# Patient Record
Sex: Female | Born: 1975 | Race: White | Hispanic: No | Marital: Married
Health system: Southern US, Community
[De-identification: ages and names within clinical notes are randomized; demographics above are authoritative.]

## PROBLEM LIST (undated history)

## (undated) DIAGNOSIS — C73 Malignant neoplasm of thyroid gland: Secondary | ICD-10-CM

---

## 2004-10-01 ENCOUNTER — Other Ambulatory Visit: Admission: RE | Admit: 2004-10-01 | Discharge: 2004-10-01 | Payer: Self-pay | Admitting: Obstetrics and Gynecology

## 2005-03-27 ENCOUNTER — Ambulatory Visit (HOSPITAL_COMMUNITY): Admission: RE | Admit: 2005-03-27 | Discharge: 2005-03-27 | Payer: Self-pay | Admitting: Obstetrics and Gynecology

## 2006-03-11 ENCOUNTER — Inpatient Hospital Stay (HOSPITAL_COMMUNITY): Admission: AD | Admit: 2006-03-11 | Discharge: 2006-03-15 | Payer: Self-pay | Admitting: Obstetrics and Gynecology

## 2006-07-03 ENCOUNTER — Other Ambulatory Visit: Admission: RE | Admit: 2006-07-03 | Discharge: 2006-07-03 | Payer: Self-pay | Admitting: Obstetrics and Gynecology

## 2007-06-02 ENCOUNTER — Encounter: Admission: RE | Admit: 2007-06-02 | Discharge: 2007-06-02 | Payer: Self-pay | Admitting: Family Medicine

## 2007-06-15 ENCOUNTER — Encounter: Admission: RE | Admit: 2007-06-15 | Discharge: 2007-06-15 | Payer: Self-pay | Admitting: Family Medicine

## 2007-06-15 ENCOUNTER — Encounter (INDEPENDENT_AMBULATORY_CARE_PROVIDER_SITE_OTHER): Payer: Self-pay | Admitting: Interventional Radiology

## 2007-06-15 ENCOUNTER — Other Ambulatory Visit: Admission: RE | Admit: 2007-06-15 | Discharge: 2007-06-15 | Payer: Self-pay | Admitting: Interventional Radiology

## 2007-06-28 ENCOUNTER — Inpatient Hospital Stay (HOSPITAL_COMMUNITY): Admission: RE | Admit: 2007-06-28 | Discharge: 2007-07-01 | Payer: Self-pay | Admitting: Obstetrics and Gynecology

## 2007-07-03 ENCOUNTER — Inpatient Hospital Stay (HOSPITAL_COMMUNITY): Admission: AD | Admit: 2007-07-03 | Discharge: 2007-07-03 | Payer: Self-pay | Admitting: Obstetrics and Gynecology

## 2007-07-05 ENCOUNTER — Ambulatory Visit (HOSPITAL_COMMUNITY): Admission: RE | Admit: 2007-07-05 | Discharge: 2007-07-06 | Payer: Self-pay | Admitting: Surgery

## 2007-07-05 ENCOUNTER — Encounter (INDEPENDENT_AMBULATORY_CARE_PROVIDER_SITE_OTHER): Payer: Self-pay | Admitting: Surgery

## 2007-08-03 ENCOUNTER — Encounter: Admission: RE | Admit: 2007-08-03 | Discharge: 2007-08-03 | Payer: Self-pay | Admitting: Endocrinology

## 2007-08-10 ENCOUNTER — Encounter: Admission: RE | Admit: 2007-08-10 | Discharge: 2007-08-10 | Payer: Self-pay | Admitting: Endocrinology

## 2007-08-17 ENCOUNTER — Encounter: Admission: RE | Admit: 2007-08-17 | Discharge: 2007-08-17 | Payer: Self-pay | Admitting: Endocrinology

## 2007-11-09 ENCOUNTER — Other Ambulatory Visit: Admission: RE | Admit: 2007-11-09 | Discharge: 2007-11-09 | Payer: Self-pay | Admitting: Obstetrics and Gynecology

## 2008-08-14 ENCOUNTER — Encounter (HOSPITAL_COMMUNITY): Admission: RE | Admit: 2008-08-14 | Discharge: 2008-10-17 | Payer: Self-pay | Admitting: Endocrinology

## 2008-08-16 ENCOUNTER — Ambulatory Visit (HOSPITAL_COMMUNITY): Admission: RE | Admit: 2008-08-16 | Discharge: 2008-08-16 | Payer: Self-pay | Admitting: Endocrinology

## 2008-08-16 IMAGING — CR DG CHEST 2V
2 series · 2 of 2 positions shown · non-contrast
Comparison: none

CLINICAL DATA: Papillary thyroid carcinoma.  Pre op respiratory exam.  
 CHEST ? 2 VIEW:

[w chest pa]
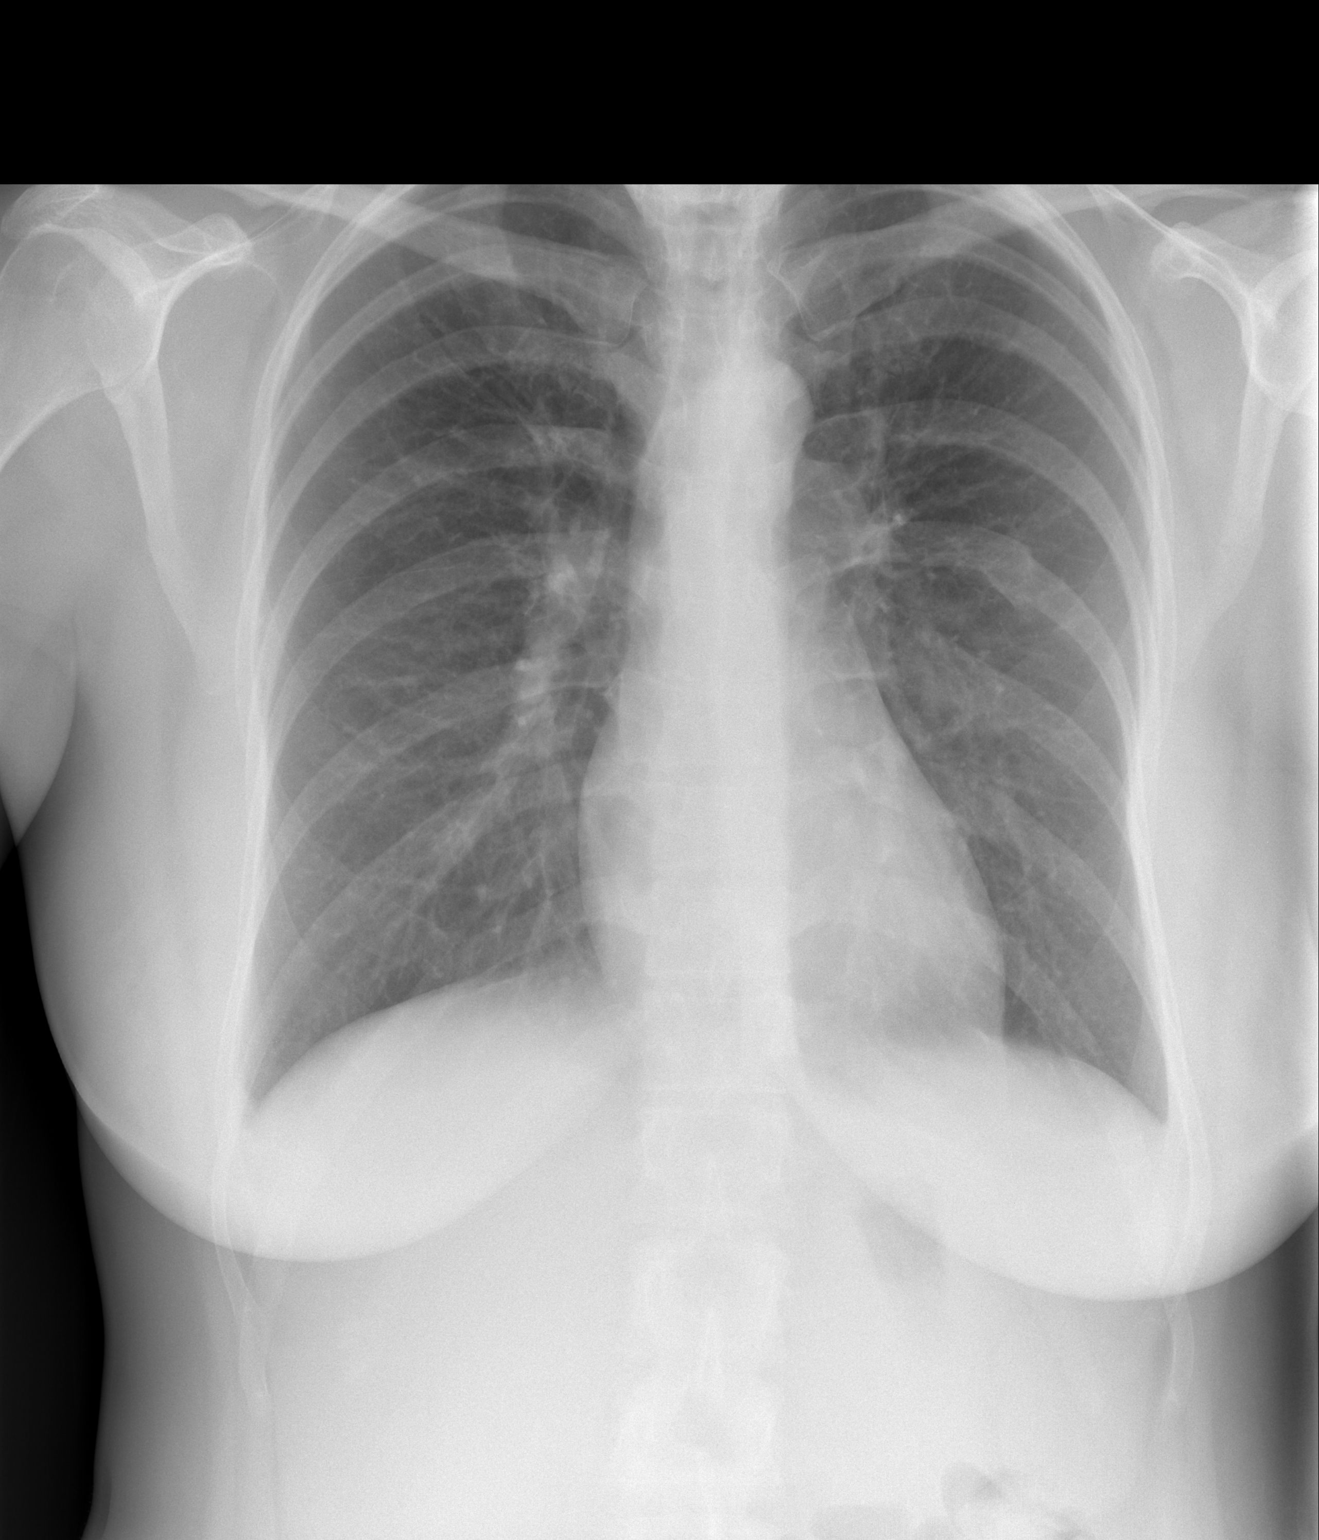

[w chest lat]
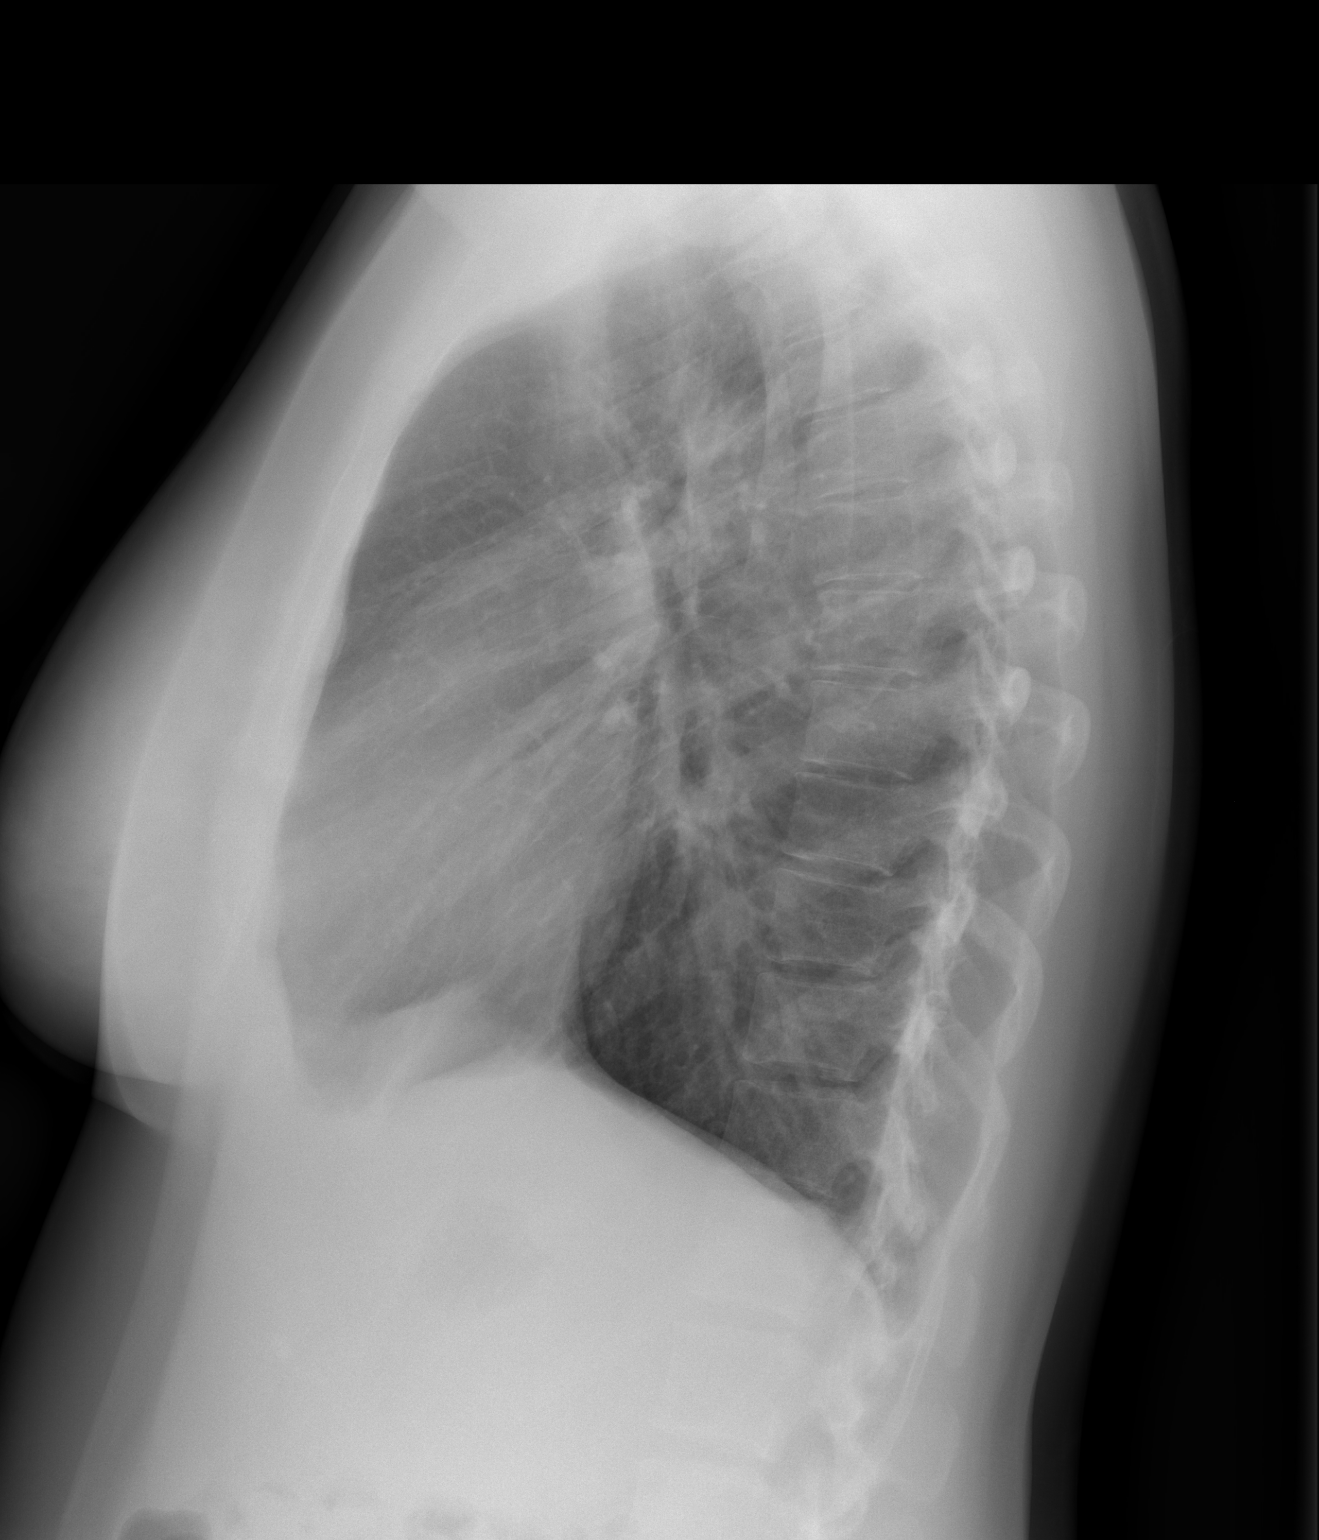

[2 of 2 positions shown; findings below may reference images not displayed]

FINDINGS: Heart size and mediastinal contours are normal.  Both lungs are clear.  There is no evidence of pleural effusion.  No mass or adenopathy identified.  Old left posterior seventh rib fracture is noted.
IMPRESSION: No active cardiopulmonary disease.

## 2008-10-18 ENCOUNTER — Encounter (HOSPITAL_COMMUNITY): Admission: RE | Admit: 2008-10-18 | Discharge: 2009-01-16 | Payer: Self-pay | Admitting: Endocrinology

## 2008-10-26 ENCOUNTER — Encounter (HOSPITAL_COMMUNITY): Admission: RE | Admit: 2008-10-26 | Discharge: 2009-01-24 | Payer: Self-pay | Admitting: Endocrinology

## 2008-11-10 ENCOUNTER — Other Ambulatory Visit: Admission: RE | Admit: 2008-11-10 | Discharge: 2008-11-10 | Payer: Self-pay | Admitting: Obstetrics and Gynecology

## 2009-08-13 ENCOUNTER — Encounter (HOSPITAL_COMMUNITY): Admission: RE | Admit: 2009-08-13 | Discharge: 2009-10-17 | Payer: Self-pay | Admitting: Endocrinology

## 2009-11-15 ENCOUNTER — Other Ambulatory Visit: Admission: RE | Admit: 2009-11-15 | Discharge: 2009-11-15 | Payer: Self-pay | Admitting: Obstetrics and Gynecology

## 2010-10-28 ENCOUNTER — Encounter (HOSPITAL_COMMUNITY)
Admission: RE | Admit: 2010-10-28 | Discharge: 2010-11-19 | Payer: Self-pay | Source: Home / Self Care | Attending: Endocrinology | Admitting: Endocrinology

## 2010-11-04 LAB — HCG, SERUM, QUALITATIVE

## 2010-11-10 ENCOUNTER — Encounter: Payer: Self-pay | Admitting: Family Medicine

## 2010-11-10 ENCOUNTER — Encounter: Payer: Self-pay | Admitting: Endocrinology

## 2010-11-28 ENCOUNTER — Other Ambulatory Visit (HOSPITAL_COMMUNITY)
Admission: RE | Admit: 2010-11-28 | Discharge: 2010-11-28 | Disposition: A | Discharge status: Home / Self Care | Payer: BC Managed Care – PPO | Source: Ambulatory Visit | Attending: Obstetrics and Gynecology | Admitting: Obstetrics and Gynecology

## 2010-11-28 ENCOUNTER — Other Ambulatory Visit: Payer: Self-pay | Admitting: Obstetrics and Gynecology

## 2010-11-28 DIAGNOSIS — Z1231 Encounter for screening mammogram for malignant neoplasm of breast: Secondary | ICD-10-CM

## 2010-11-28 DIAGNOSIS — Z01419 Encounter for gynecological examination (general) (routine) without abnormal findings: Secondary | ICD-10-CM | POA: Insufficient documentation

## 2010-12-16 ENCOUNTER — Ambulatory Visit
Admission: RE | Admit: 2010-12-16 | Discharge: 2010-12-16 | Disposition: A | Discharge status: Home / Self Care | Payer: BC Managed Care – PPO | Source: Ambulatory Visit | Attending: Obstetrics and Gynecology | Admitting: Obstetrics and Gynecology

## 2010-12-16 DIAGNOSIS — Z1231 Encounter for screening mammogram for malignant neoplasm of breast: Secondary | ICD-10-CM

## 2011-01-23 LAB — HCG, SERUM, QUALITATIVE: Preg, Serum: NEGATIVE

## 2011-02-03 LAB — HCG, SERUM, QUALITATIVE: Preg, Serum: NEGATIVE

## 2011-03-04 NOTE — Op Note (Signed)
Angela Burke, Angela Burke               ACCOUNT NO.:  [id number]   MEDICAL RECORD NO.:  [id number]          PATIENT TYPE:  INP   LOCATION:  9126                          [facility]ACILITY:  [facility]   PHYSICIAN:  Charles A. Delcambre, MDDATE OF BIRTH:  26-Sep-1976   DATE OF PROCEDURE:  06/28/2007  DATE OF DISCHARGE:                               OPERATIVE REPORT   PREOPERATIVE DIAGNOSES:  1. Intrauterine pregnancy at 37 weeks 3 days.  2. Previous cesarean section.  3. Newly-diagnosed thyroid cancer.   POSTOPERATIVE DIAGNOSES:  1. Intrauterine pregnancy at 37 weeks 3 days.  2. Previous cesarean section.  3. Newly-diagnosed thyroid cancer.   PROCEDURE:  Repeat low transverse cesarean section.   SURGEON:  Charles A. Sydnee Cabal, MD   ASSISTANT:  Gerald Leitz, MD   COMPLICATIONS:  Dense adhesions of the uterus to the anterior abdominal  wall.   OPERATIVE FINDINGS:  Vigorous female, Apgars 8 and 9, 9 pounds 3 ounces.  Placenta to L&D.   ESTIMATED BLOOD LOSS:  600 mL.   Nuchal cord X1 reduced for delivery with vacuum extraction.  Instrument,  sponge and needle count correct x2.   DESCRIPTION OF PROCEDURE:  The patient was taken to the operating room  and placed in supine position after spinal was injected without  difficulty.  She was then sterilely prepped and draped.  A repeat  incision through the old scar was made and taken down to fascia.  Fascia  was incised with a knife and Mayo scissors.  Rectus muscles were sharply  dissected in the midline.  Peritoneum was entered with hemostats and  Metzenbaum scissors without damage to bowel, bladder or vascular  structures.  Adhesions were isolated, crossclamped, cut and tied with 0  free ties.  Hemostasis was good.  This allowed mobilization of the  uterus adequately.  The lower uterine segment bladder flap was developed  with Metzenbaum scissors to incise the uterovesical peritoneum and some  sharp dissection was used to take the bladder down further.   The bladder  blade was replaced.  A lower uterine segment transverse incision was  made to amniotomy.  Vertical traction was applied.  A hand was inserted.  Head remained high.  A Mityvac bell-shaped vacuum was placed on the  occiput and the infant was delivered with fundal pressure by the  operator's assistance with the assistance of the vacuum extractor  without difficulty.  A nuchal cord was reduced.  Infant was further  delivered without difficulty.  Cord was clamped.  Baby was shown to the  parents and taken to the neonatologist who was in attendance.  Placenta  was manually extracted.  Uterus was closed then internally with #1  chromic running locking first layer, #1 chromic running imbricating  second layer.  There were several areas where the adhesions had been  taken down as well as left end of the incision on the uterus where there  was some oozing.  Vicryl 2-0 with an SH was used for figure-of-eight  sutures to these areas with good hemostasis resulting.  Irrigation was  carried out and hemostasis was excellent to  the bladder flap as well as  the uterine incision site as well as the adhesion sites.  The bladder  blade was then removed.  Subfascial hemostasis was good along the rectus  muscles subpubically.  Fascia was then closed with #1 Vicryl running  nonlocking suture.  Subcutaneous hemostasis was excellent after  irrigation.  Minor electrocautery was used and sterile skin clips were  used close the skin.  A sterile dressing was applied.  The patient  tolerated her procedure well.  She had received 2 g of Ancef after the  cord was clamped and as she is going to have thyroid surgery next  Monday, I would like to continue Ancef 2 g q.6h. for 24 hours.  All  questions were answered.  She was taken to the recovery room with  physician in attendance.      Charles A. Sydnee Cabal, MD  Electronically Signed     CAD/MEDQ  D:  06/28/2007  T:  06/29/2007  Job:  [id number]

## 2011-03-04 NOTE — H&P (Signed)
NAMEDALANA, Angela Burke               ACCOUNT NO.:  [id number]   MEDICAL RECORD NO.:  [id number]          PATIENT TYPE:  INP   LOCATION:  NA                            [facility]   PHYSICIAN:  Charles A. Delcambre, MDDATE OF BIRTH:  [birth year]   DATE OF ADMISSION:  DATE OF DISCHARGE:                              HISTORY & PHYSICAL   ADMISSION HISTORY AND PHYSICAL:  This patient will be admitted to  undergo repeat cesarean section at 37 weeks 3 days secondary to an  enlarging thyroid that has been diagnosed with thyroid papillary  carcinoma.  She is to have surgery with Dr. Ricky Stabs 10 days post  cesarean section and about 4-5 weeks later undergo the radiation  ablation with Dr. Deboraha Sprang at [facility].  She is a 35 year old gravida  2, para 1-0-0-1 with only complication of pregnancy being asthma where  she used albuterol, Allegra and Advair p.r.n., not often.   PAST MEDICAL HISTORY:  Asthma.   PAST SURGICAL HISTORY:  Cesarean section, primary low transverse, for  arrest of dilation at 8 cm CPD.   MEDICATIONS:  Albuterol, Allegra, Advair p.r.n., prenatal vitamins  daily.   TESTING PENDING:  Group B strep done on September 3.   ALLERGIES:  No known drug allergies.   SOCIAL HISTORY:  Married, in obvious relationship with her husband.  No  history of STDs.  No tobacco, ethanol or drugs.   FAMILY HISTORY:  Family history of suicide in an uncle.  Mother with  lupus.  Paternal grandmother with lung cancer.  Chronic hypertension in  father.  Heart disease in maternal grandfather and paternal grandfather.  Thyroid dysfunction in her mother and sisters.  Diabetes in maternal  grandmother and maternal grandfather.   REVIEW OF SYSTEMS:  Denies fever or chills.  She does have pressure on  her neck and when she swallows from a rapidly enlarging thyroid.  No  chest pain, shortness of breath, wheezing, contractions, rupture of  membranes or bleeding, diarrhea or constipation.   PHYSICAL  EXAMINATION:  GENERAL:  Alert and oriented x3.  No distress  except for bothersome thyroid as noted above which biopsy has returned  papillary carcinoma and biopsy done June 15, 2007.  LABORATORY:  A positive.  Rh negative.  VDRL nonreactive.  Rubella  immune.  Hepatitis B surface antigen negative.  HIV nonreactive.  She  declined first trimester screen and AFP.  One-hour Glucola 126.  Hemoglobin at 28 weeks of 12.3.  Group B strep done June 23, 2007,  and pending.  HEENT:  Right thyroid is grossly enlarged.  LUNGS:  Clear.  HEART:  Regular rate and rhythm with a 2/6 systolic ejection murmur.  ABDOMEN:  Gravid.  Fundal height 41.  Ultrasound was done August 13  showing 58% 15-cm AFI, estimated fetal weight 90%, 5 pounds 14 ounces at  that time.  Fetal heart rate 150s.  EXTREMITIES:  Minimal edema bilaterally.  CERVIX:  Not checked.   ASSESSMENT:  Intrauterine pregnancy at 37 weeks 3 days upon admission.  To undergo repeat low transverse cesarean section as recommended by Dr.  Jerkins, and I would concur, with newly diagnosed and rapidly enlarging  and symptomatic by local pressure thyroid cancer, papillary type, so  this justifies delivery at 37 weeks 3 days rather than waiting until 39  weeks so that she can undergo her total thyroidectomy 10 days post-op as  soon as possible.   Questions were answered.  She gives informed consent, accepts risks of  infection, bowel or bladder damage, ureteral damage, blood product risk  including hepatitis and HIV exposure.  All questions were answered.  Will proceed with pre-op CBC and type and screen.      Charles A. Sydnee Cabal, MD  Electronically Signed     CAD/MEDQ  D:  06/24/2007  T:  06/24/2007  Job:  [id number]

## 2011-03-04 NOTE — Op Note (Signed)
[name], [name] NO.:  [id number]   MEDICAL RECORD NO.:  [id number]          PATIENT TYPE:  AMB   LOCATION:  DAY                          FACILITY:  [facility]   PHYSICIAN:  Angela Heckler, MD      DATE OF BIRTH:  Jul 04, 1976   DATE OF PROCEDURE:  07/05/2007  DATE OF DISCHARGE:                               OPERATIVE REPORT   PREOPERATIVE DIAGNOSIS:  Papillary thyroid carcinoma.   POSTOPERATIVE DIAGNOSIS:  Papillary thyroid carcinoma.   PROCEDURE:  1. Total thyroidectomy.  2. Central compartment lymph node dissection (zone 6).  3. Right internal jugular lateral lymph node excision.   SURGEON:  Angela Heckler, MD, FACS   ASSISTANT:  Angela Muckle, MD   ANESTHESIA:  General per Dr. Ronelle Burke.   PREPARATION:  Betadine.   COMPLICATIONS:  None.   BLOOD LOSS:  Minimal.   INDICATIONS:  The patient is a 35 year old white female from [place],  [place].  The patient was approximately 33 weeks estimated  gestational age of her second pregnancy when she noted a neck mass.  She  presented to her primary physician.  She underwent ultrasound that  showed a 5 cm heterogeneous mass in the right lobe of the thyroid.  Fine-  needle aspiration biopsy was performed, and confirmed papillary thyroid  carcinoma.  The patient was referred to general surgery and prepared for  the operating room.   BODY OF REPORT:  Procedure was done in OR #11 at the [facility].  The patient was brought to the operating room, and  placed in supine position on the operating room table.  Following  administration of general anesthesia, the patient is positioned, and  then prepped and draped in the usual strict aseptic fashion.  After  ascertaining that an adequate level of anesthesia had been obtained, a  Kocher incision was made with a #15 blade.  Dissection was carried down  through subcutaneous tissues and platysma.  Hemostasis was obtained with  electrocautery.  Skin flaps were elevated cephalad and caudad from the  thyroid notch to the sternal notch.   A Mahorner self-retaining retractor was placed for exposure.  Strap  muscles were incised in the midline.  The left strap muscles were  reflected laterally to expose the left thyroid lobe which was grossly  normal.  Palpation on the right side reveals a dominant mass in the  right thyroid lobe.  There was also a firm mass lateral and inferior  just above the clavicle on the right.  Strap muscles were reflected  laterally, and the right lobe was exposed.  Strap muscles were mildly  adherent to the capsule, but were gently dissected away, and  electrocautery used for hemostasis.  Venous tributaries were divided  between Ligaclips with the harmonic scalpel.  Gland is mobilized with a  Pension scheme manager.   Superior pole vessels are dissected out, ligated in continuity with 2-0  silk ties, divided with the harmonic scalpel.  Additional vessels were  divided between medium Ligaclips with the harmonic scalpel.  The gland  is gently  mobilized and rotated anteriorly.  Parathyroid tissue was  identified and preserved.  An inferior parathyroid gland appears to be  on the capsule of the inferior pole of the right lobe.  This gland was  dissected off of the capsule and placed in saline for later  reimplantation.  Gland was then further mobilized by dividing the  branches of the inferior thyroid artery between small Ligaclips with the  harmonic scalpel.  Gland is gently rolled anteriorly and the recurrent  nerve was identified and preserved.  Dissection was carried down to the  ligament of Angela Burke which was transected with electrocautery, and the  gland was rolled up and onto the anterior trachea.  Isthmus was  mobilized across the midline.  There was no significant pyramidal lobe  identified.   Next, we turned our attention to the right thyroid lobe.  Again, strap  muscles were reflected  laterally.  The venous tributaries were divided  between medium Ligaclips with the harmonic scalpel.  Superior pole  vessels were dissected out and divided between medium Ligaclips with the  harmonic scalpel.  Gland was rolled anteriorly.  Both superior and  inferior parathyroid glands were identified and preserved.  Recurrent  nerve was identified and preserved.  Branches of the inferior thyroid  artery are divided between small Ligaclips with the harmonic scalpel.  Ligament of Angela Burke was transected with electrocautery, and the gland was  rolled up and onto the anterior trachea from which it was completely  excised with the harmonic scalpel.  Sutures were used to mark the right  superior pole.  The entire gland was submitted to pathology for review.   Next, the central compartment lymph nodes were dissected out using the  electrocautery and blunt dissection with a right-angle clamp.  Several  small lymph nodes appear to be present.  Dissection was carried  inferiorly down to the level of the thymus.  The superior horn of the  thymus is included with the specimen.  Good hemostasis was achieved with  the harmonic scalpel and Ligaclips.  Specimen was submitted separately  to pathology labeled central compartment lymph nodes (zone 6).   Next, we turned our attention to the inferior right lateral neck.  A  palpable mass was present.  The carotid sheath was opened, and the  jugular veins mobilized medially.  However, the mass appeared to be  quite lateral to the jugular vein.  Therefore, the sternocleidomastoid  muscle was rotated medially, and an incision was made at its lateral  edge.  Dissection was carried into the scalene fat pad which was opened.  A large approximately 3.5 cm lymph node identified.  There are adjacent  smaller lymph nodes.  These were all gently dissected out.  The vascular  tributaries were divided between medium Ligaclips with the harmonic  scalpel.  The harmonic  scalpel was used to dissect out the rest of the  lymph node mass in its entirety.  It is submitted separately to  pathology labeled right internal jugular lymph node.   Good hemostasis was noted.  Muscles are reapproximated laterally with  interrupted 3-0 Vicryl sutures.  At this point, a muscular pocket is  created in the right sternocleidomastoid muscle, and the right inferior  parathyroid gland is sectioned into small 1 mm fragments and inserted  into the sternocleidomastoid muscle, and secured with a 3-0 Vicryl  figure-of-eight suture.  Thyroid bed was then copiously irrigated with  warm saline which was evacuated.  Good hemostasis was noted.  Surgicel  was placed in the operative field.  Strap muscles were reapproximated in  the midline with interrupted 3-0 Vicryl sutures.  Platysma was closed  with interrupted 3-0 Vicryl sutures.  Skin was  closed with a running 4-0 Monocryl subcuticular suture.  Wound was  washed and dried and Benzoin and Steri-Strips were applied.  Sterile  dressings were applied.  The patient was awakened from anesthesia and  brought to the recovery room in stable condition.  The patient tolerated  the procedure well.      Angela Heckler, MD  Electronically Signed     TMG/MEDQ  D:  07/05/2007  T:  07/05/2007  Job:  [id number]   cc:   Pam Drown, M.D.  Fax: [phone]   Chales Salmon. Abigail Miyamoto, M.D.  Fax: [phone]   Charles A. Sydnee Cabal, MD  Fax: [phone]   Dorisann Frames, M.D.  Fax: [phone]

## 2011-03-04 NOTE — Discharge Summary (Signed)
Angela Burke, Angela Burke               ACCOUNT NO.:  [id number]   MEDICAL RECORD NO.:  [id number]          PATIENT TYPE:  INP   LOCATION:  9126                          [facility]ILITY[facility]   PHYSICIAN:  Charles A. Delcambre, MDDATE OF BIRTH:  Jul 06, 1976   DATE OF ADMISSION:  06/28/2007  DATE OF DISCHARGE:  07/01/2007                               DISCHARGE SUMMARY   PRIMARY DISCHARGE DIAGNOSES:  1. Previous cesarean section for repeat cesarean section.  2. Newly diagnosed thyroid cancer.  3. Intrauterine pregnancy at 37 weeks 3 days.   PROCEDURE:  Repeat low transverse cesarean section.   DISPOSITION:  The patient is discharged home to follow up in the office  in 24 hours to discontinue staples.  She will follow up at 0730 on  Monday, July 05, 2007 with Dr. Gerrit Friends to undergo a thyroidectomy  secondary to thyroid cancer that arose during the last several months to  become evident during the pregnancy several weeks ago.  She is given a  prescription for Percocet 5/325, #40, 1-2 p.o. every 4 hours p.r.n.,  __________  1 p.o. daily, #30, refill times 1.   CONVALESCING INSTRUCTIONS:  Rest at home over the weekend.  Notify if  temperature greater than 101 degrees, incision redness or drainage.  No  lifting greater than about 20 pounds or baby for one month.  Shower okay  for 2 weeks, bath thereafter.  Otherwise per Surgery recommendations  after Monday.  No driving for 2 weeks.  Notify with increased bleeding  or pain.   LABORATORY DATA:  Postoperatively hemoglobin 10.3, hematocrit 29.3.  Baby of 53.5 cm, 4175 gm.  Apgar's 8 and 9.   HISTORY AND PHYSICAL:  Dictated on the chart.   HOSPITAL COURSE:  The patient was admitted, underwent surgery as noted  above.  Surgery was moved up secondary to the urgency to get the  thyroidectomy done in discussing with Dr. Gerrit Friends.  All questions were  answered and the patient gave informed consent.  We proceeded with a  repeat cesarean section.   Postoperatively she had good pain relief with  Duramorph then changed over to Percocet.  She had spontaneous flatus  returning postop day #1, was given a general diet and she tolerated  general diet, was ambulating and voiding without difficulty after  catheter was discontinued.  She continued to do well on postop day #2.  Postop day #3 she remained afebrile, vitals were stable, pain was  controlled, diet was tolerated, she was voiding without difficulty, and  was discharged home with follow up as noted above.      Charles A. Sydnee Cabal, MD  Electronically Signed    CAD/MEDQ  D:  07/01/2007  T:  07/01/2007  Job:  [id number]

## 2011-03-07 NOTE — H&P (Signed)
Angela Burke, Angela Burke NO.:  [id number]   MEDICAL RECORD NO.:  [id number]           PATIENT TYPE:   LOCATION:  9124                          [facility]ILITY[facility]   PHYSICIAN:  Charles A. Delcambre, MDDATE OF BIRTH:  07/11/1976   DATE OF ADMISSION:  DATE OF DISCHARGE:                                HISTORY & PHYSICAL   This is a 35 year old, para 0-0-0-0, with an EDC of Mar 08, 2006, to be 41  weeks 2 days estimated gestational age at the time of induction, to be  admitted for Pitocin induction secondary to post dates at least beyond 41  weeks and to fit surgical scheduling at the hospital (this pregnancy  achieved with Clomid) and complicated by asthma with Advair discontinued,  albuterol inhaler used very seldom, left ovarian simple cyst of 5.6 cm  resolved, anemia treated with iron therapy.  Hemoglobin at 28 weeks was  11.0, one-hour Glucola 128, group B strep negative, HIV negative, hepatitis  B surface antigen negative, rubella immune, VDRL nonreactive, blood type A  positive, antibody screen negative. TSH normal. Cystic fibrosis normal. GC  and Chlamydia declined. Pap negative.   PAST MEDICAL HISTORY:  Asthma and allergies.   PAST SURGICAL HISTORY:  None.   MEDICATIONS:  1.  Prenatal vitamins.  2.  Advair discontinued.  3.  Albuterol p.r.n., seldom used.  4.  Allegra.  5.  Iron once a day.   ALLERGIES:  No known drug allergies.   SOCIAL HISTORY:  No tobacco, ethanol, or drug use. No STD exposure. The  patient is married, a monogamous relationship with her husband.   FAMILY HISTORY:  Denies family history of breast, uterus, ovaries, cervix,  colon cancer or lymphoma; coronary artery disease, stroke. Grandparents have  diabetes, hypertension, MI, kidney disease, and arthritis. Uncle with  depression and suicide.   REVIEW OF SYSTEMS:  Denies fever, chills, rashes, lesions, headaches,  dizziness, some seasonal allergies are present.  No chest pain,  shortness of  breath, bleeding, diarrhea, constipation, melena, or hematochezia. No  urgency. Some frequency. No dysuria. Denies hematuria, glycosuria is  present, minor amount. Recently no emotional changes.   PHYSICAL EXAMINATION:  GENERAL: Alert and oriented times three, in no  distress.  VITAL SIGNS: Blood pressure 108/78, respirations 18, pulse 90, weight 199  pounds. Fetal heart rate 155.  HEENT: Grossly within normal limits.  NECK: Supple without thyromegaly or adenopathy.  LUNGS: Clear bilaterally.  BACK: No CVAT. Vertebral column nontender to palpation.  HEART: Regular rate and rhythm without murmurs, rubs, or gallop.  BREASTS: No masses, tenderness, discharge, skin or nipple change  bilaterally.  ABDOMEN: Fundal height 40 cm, gravid, estimated fetal weight 3700 gm.  PELVIC EXAM: Less than or equal to 2 cm, posterior height, soft and vertex.  Pelvis palpates adequately.  EXTREMITIES: Moderate edema to mid calf bilaterally, symmetrical, chronic in  nature.   ASSESSMENT:  Intrauterine pregnancy at 41 weeks 2 days for induction.   PLAN:  Pitocin induction, high dose protocol. Questions were answered and  she gives informed consent. We will proceed as outlined.  Charles A. Sydnee Cabal, MD  Electronically Signed     CAD/MEDQ  D:  03/09/2006  T:  03/09/2006  Job:  [id number]

## 2011-03-07 NOTE — Op Note (Signed)
Angela Burke, Angela Burke               ACCOUNT NO.:  [id number]   MEDICAL RECORD NO.:  [id number]          PATIENT TYPE:  INP   LOCATION:  9124                          FACILITY:  [facility]   PHYSICIAN:  Charles A. Delcambre, MDDATE OF BIRTH:  Dec 31, 1975   DATE OF PROCEDURE:  03/12/2006  DATE OF DISCHARGE:                                 OPERATIVE REPORT   PREOPERATIVE DIAGNOSES:  1.  Intrauterine pregnancy at 40 weeks and 3 days.  2.  Arrest of dilation at 8 cm.  3.  Cephalopelvic disproportion.  4.  Meconium.   POSTOPERATIVE DIAGNOSES:  1.  Intrauterine pregnancy at 40 weeks and 3 days.  2.  Arrest of dilation at 8 cm.  3.  Cephalopelvic disproportion.  4.  Meconium.   OPERATION PERFORMED:  Primary low transverse cesarean section.   SURGEON:  Charles A. Delcambre, MD   ASSISTANT:  None.   ANESTHESIA:  Epidural.   COMPLICATIONS:  Meconium.   ESTIMATED BLOOD LOSS:  1300 mL.   SPECIMENS:  None.   OPERATIVE FINDINGS:  Apgars 9 and 9.  Cord arterial blood gases 7.31 pH.  Vigorous female 8 pounds 5 ounces.   Instrument, sponge and needle counts correct x3.   DESCRIPTION OF PROCEDURE:  The patient was taken to the operating room,  placed in supine position after anesthesia was dosed and was adequate.  Sterile prep and drape was then taken.  Pfannenstiel incision was made with  knife, carried down to fascia.  Fascia was incised with knife and Mayo  scissors.  Rectus sheath was released superiorly and inferiorly.  Bleeding  was incurred on the rectus muscle requiring several stitches of 2-0 Vicryl  and #1 Vicryl.  Hemostasis was achieved.  Rectus muscle was further released  from the rectus sheath and separated in the midline bluntly.  Peritoneum was  entered with Metzenbaum scissors without damage to bladder.  Traction was  used to extend the incision.  Bladder blade was placed.  Vesicouterine  peritoneum was incised with Metzenbaum scissors.  Blunt dissection was used  to  develop the bladder flap.  The lower uterine segment a transverse  incision was made for amniotomy.  Although care was given intentionally to  with obstructed labor to avoid injury with the knife, the baby in very small  approximately 8 to 10 mm very superficial laceration was made on the  inferior left check of the baby and this was shown to the neonatologist who  was in attendance and will be pointed out to the parents on postoperative  check.  The infant was delivered as far as the head and shoulders.  DeLee  suction was carried out on the abdomen and upon delivery with fundal  pressure with operator's assistant, the baby was vigorously crying  spontaneously.  Cord was clamped and the infant was shown to the parents and  handed off to neonatologist.  Cord blood was collected.  Placenta was passed  off to collect a fair amount of cord blood and then go to labor and  delivery.  The uterus was externalized for repair.  Bleeding was  encountered  in small arteries not felt to be the main uterine artery, small arteries  laterally on both sides.  This increased the blood loss during the case  significantly.  After isolating these arteries and branches from the uterine  arteries and ligating them individually with 2-0 Vicryl, hemostasis was  achieved somewhat.  The remainder of the incision was closed with #0 chromic  Several figure-of-eight sutures of 0 chromic were used to achieve hemostasis  of the uterine incision.  Bladder flap hemostasis was excellent.  Uterus was  reinternalized for further repair.  One lateral small branch area was still  bleeding and a further simple stitch of 2-0 Vicryl was placed with good  hemostasis resulting.  Irrigation was carried out.  Pericolic gutters and  pelvis were cleaned of clotted blood and material.  Hemostasis was verified  to be good.  Subfascial hemostasis was excellent.  Fascia was closed with #1  Vicryl running nonlocking suture.  Subcutaneous  hemostasis was excellent  after minor electrocautery.  0 Vicryl interrupted sutures x 5 were used to  close this space and sterile skin clips were used to close the skin.  Pressure dressing was applied.  The patient was taken to recovery with  physician in attendance.      Charles A. Sydnee Cabal, MD  Electronically Signed     CAD/MEDQ  D:  03/12/2006  T:  03/12/2006  Job:  [id number]

## 2011-03-07 NOTE — Discharge Summary (Signed)
Angela Burke, Angela Burke               ACCOUNT NO.:  [id number]   MEDICAL RECORD NO.:  [id number]          PATIENT TYPE:  INP   LOCATION:  9124                          FACILITY:  [facility]   PHYSICIAN:  Charles A. Delcambre, MDDATE OF BIRTH:  [birth year]   DATE OF ADMISSION:  03/11/2006  DATE OF DISCHARGE:  03/15/2006                                 DISCHARGE SUMMARY   DISCHARGE DIAGNOSES:  1.  Intrauterine pregnancy, 40 weeks and 3 days.  2.  Arrest of dilation at 8 cm.  3.  Cephalopelvic disproportion.  4.  Meconium.   PROCEDURE:  Primary low transverse cesarean section.   OPERATIVE FINDINGS:  A vigorous female with Apgar's 9 and 9. Cord arterial  blood pH gas 7.31. Weight 8 pounds and 5 ounces.   DISPOSITION:  The patient is discharged home to follow up in the office in  10 days. She was given convalescence instructions to notify if temperature  of greater than 100 degrees or redness or drainage from the incision,  increased bleeding or pain.  She was instructed to not drive x2 weeks and to  not bathe x2 weeks but shower okay.  No sexual intercourse x4 weeks. No  lifting greater than 25 pounds x4 weeks.   DISCHARGE MEDICATIONS:  1.  She was given a prescription for Tandem F one p.o. daily #30 refill x1.  2.  Motrin 600 mg p.o. q.6h p.r.n. #30 refill x1.  3.  Percocet 5/325 1-2 p.o. q.4 hours p.r.n. #40 with instructions.   LABORATORY DATA:  Postoperative hematocrit 23.7, hemoglobin 8.1.   HISTORY AND PHYSICAL:  As dictated in the chart.   HOSPITAL COURSE:  The patient was admitted with onset of labor. She  progressed on to 8.5 cm where she had arrest of labor. She was taken for  cesarean section. She had  a routine postoperative course and had good pain  control with p.o. medication.  Return of flatus postoperative day #1. Given  a general diet that day which she tolerated well.  She continued to ambulate  well on postoperative days number 2 and 3.  She continued to tolerate a  diet  well. She had no difficulty voiding as well.  She was discharged home with  polyp as noted above, on postoperative day 3-1/2.      Charles A. Sydnee Cabal, MD  Electronically Signed    CAD/MEDQ  D:  04/02/2006  T:  04/02/2006  Job:  [id number]

## 2011-07-22 LAB — HCG, SERUM, QUALITATIVE: Preg, Serum: NEGATIVE

## 2011-07-25 LAB — HCG, SERUM, QUALITATIVE: Preg, Serum: NEGATIVE

## 2011-07-31 LAB — BASIC METABOLIC PANEL
CO2: 27
Calcium: 8.3 — ABNORMAL LOW
Glucose, Bld: 112 — ABNORMAL HIGH
Potassium: 3.5
Sodium: 136

## 2011-07-31 LAB — URINALYSIS, ROUTINE W REFLEX MICROSCOPIC
Bilirubin Urine: NEGATIVE
Glucose, UA: NEGATIVE
Ketones, ur: NEGATIVE
Nitrite: NEGATIVE
Protein, ur: NEGATIVE
Specific Gravity, Urine: 1.012
Urobilinogen, UA: 0.2
pH: 7.5

## 2011-07-31 LAB — DIFFERENTIAL
Basophils Absolute: 0
Basophils Relative: 0
Eosinophils Absolute: 0.4
Eosinophils Relative: 4
Monocytes Absolute: 0.8 — ABNORMAL HIGH

## 2011-07-31 LAB — URINE MICROSCOPIC-ADD ON

## 2011-07-31 LAB — CBC
HCT: 32.7 — ABNORMAL LOW
Hemoglobin: 11.3 — ABNORMAL LOW
MCHC: 34.6
RDW: 14.2 — ABNORMAL HIGH

## 2011-08-01 LAB — CBC
HCT: 29.3 — ABNORMAL LOW
HCT: 30.4 — ABNORMAL LOW
HCT: 35.8 — ABNORMAL LOW
Hemoglobin: 10.7 — ABNORMAL LOW
Hemoglobin: 12.6
MCHC: 35.3
MCV: 91.4
RBC: 3.2 — ABNORMAL LOW
RBC: 3.33 — ABNORMAL LOW
RDW: 13.9
RDW: 14.2 — ABNORMAL HIGH
WBC: 11.6 — ABNORMAL HIGH

## 2011-08-01 LAB — CCBB MATERNAL DONOR DRAW

## 2011-12-01 ENCOUNTER — Other Ambulatory Visit: Payer: Self-pay | Admitting: Obstetrics and Gynecology

## 2011-12-01 ENCOUNTER — Other Ambulatory Visit (HOSPITAL_COMMUNITY)
Admission: RE | Admit: 2011-12-01 | Discharge: 2011-12-01 | Disposition: A | Discharge status: Home / Self Care | Payer: BC Managed Care – PPO | Source: Ambulatory Visit | Attending: Obstetrics and Gynecology | Admitting: Obstetrics and Gynecology

## 2011-12-01 DIAGNOSIS — Z01419 Encounter for gynecological examination (general) (routine) without abnormal findings: Secondary | ICD-10-CM | POA: Insufficient documentation

## 2012-08-04 ENCOUNTER — Other Ambulatory Visit: Payer: Self-pay | Admitting: Endocrinology

## 2012-08-04 DIAGNOSIS — C73 Malignant neoplasm of thyroid gland: Secondary | ICD-10-CM

## 2012-08-13 ENCOUNTER — Ambulatory Visit
Admission: RE | Admit: 2012-08-13 | Discharge: 2012-08-13 | Disposition: A | Discharge status: Home / Self Care | Payer: BC Managed Care – PPO | Source: Ambulatory Visit | Attending: Endocrinology | Admitting: Endocrinology

## 2012-08-13 DIAGNOSIS — C73 Malignant neoplasm of thyroid gland: Secondary | ICD-10-CM

## 2012-12-01 ENCOUNTER — Other Ambulatory Visit (HOSPITAL_COMMUNITY)
Admission: RE | Admit: 2012-12-01 | Discharge: 2012-12-01 | Disposition: A | Discharge status: Home / Self Care | Payer: BC Managed Care – PPO | Source: Ambulatory Visit | Attending: Obstetrics and Gynecology | Admitting: Obstetrics and Gynecology

## 2012-12-01 ENCOUNTER — Other Ambulatory Visit: Payer: Self-pay | Admitting: Obstetrics and Gynecology

## 2012-12-01 DIAGNOSIS — Z01419 Encounter for gynecological examination (general) (routine) without abnormal findings: Secondary | ICD-10-CM | POA: Insufficient documentation

## 2013-01-24 ENCOUNTER — Other Ambulatory Visit: Payer: Self-pay | Admitting: Endocrinology

## 2013-01-24 DIAGNOSIS — C73 Malignant neoplasm of thyroid gland: Secondary | ICD-10-CM

## 2013-02-28 ENCOUNTER — Other Ambulatory Visit: Payer: BC Managed Care – PPO

## 2013-03-02 ENCOUNTER — Other Ambulatory Visit: Payer: BC Managed Care – PPO

## 2013-03-09 ENCOUNTER — Ambulatory Visit
Admission: RE | Admit: 2013-03-09 | Discharge: 2013-03-09 | Disposition: A | Discharge status: Home / Self Care | Payer: BC Managed Care – PPO | Source: Ambulatory Visit | Attending: Endocrinology | Admitting: Endocrinology

## 2013-03-09 DIAGNOSIS — C73 Malignant neoplasm of thyroid gland: Secondary | ICD-10-CM

## 2013-08-31 ENCOUNTER — Other Ambulatory Visit (HOSPITAL_COMMUNITY): Payer: Self-pay | Admitting: Endocrinology

## 2013-08-31 DIAGNOSIS — C73 Malignant neoplasm of thyroid gland: Secondary | ICD-10-CM

## 2013-09-26 ENCOUNTER — Encounter (HOSPITAL_COMMUNITY)
Admission: RE | Admit: 2013-09-26 | Discharge: 2013-09-26 | Disposition: A | Discharge status: Home / Self Care | Payer: BC Managed Care – PPO | Source: Ambulatory Visit | Attending: Endocrinology | Admitting: Endocrinology

## 2013-09-26 DIAGNOSIS — C73 Malignant neoplasm of thyroid gland: Secondary | ICD-10-CM

## 2013-09-26 MED ORDER — THYROTROPIN ALFA 1.1 MG IM SOLR
0.9000 mg | INTRAMUSCULAR | Status: AC
  Administered 2013-09-26: 0.9 mg via INTRAMUSCULAR
  Filled 2013-09-26: qty 0.9

## 2013-09-27 ENCOUNTER — Encounter (HOSPITAL_COMMUNITY)
Admission: RE | Admit: 2013-09-27 | Discharge: 2013-09-27 | Disposition: A | Discharge status: Home / Self Care | Payer: BC Managed Care – PPO | Source: Ambulatory Visit | Attending: Endocrinology | Admitting: Endocrinology

## 2013-09-27 MED ORDER — THYROTROPIN ALFA 1.1 MG IM SOLR
0.9000 mg | INTRAMUSCULAR | Status: AC
  Administered 2013-09-27: 0.9 mg via INTRAMUSCULAR
  Filled 2013-09-27: qty 0.9

## 2013-09-28 ENCOUNTER — Encounter (HOSPITAL_COMMUNITY)
Admission: RE | Admit: 2013-09-28 | Discharge: 2013-09-28 | Disposition: A | Discharge status: Home / Self Care | Payer: BC Managed Care – PPO | Source: Ambulatory Visit | Attending: Endocrinology | Admitting: Endocrinology

## 2013-09-30 ENCOUNTER — Encounter (HOSPITAL_COMMUNITY)
Admission: RE | Admit: 2013-09-30 | Discharge: 2013-09-30 | Disposition: A | Discharge status: Home / Self Care | Payer: BC Managed Care – PPO | Source: Ambulatory Visit | Attending: Endocrinology | Admitting: Endocrinology

## 2013-09-30 MED ORDER — SODIUM IODIDE I 131 CAPSULE
4.3000 | Freq: Once | INTRAVENOUS | Status: AC | PRN
  Administered 2013-09-30: 4.3 via ORAL

## 2013-11-10 ENCOUNTER — Other Ambulatory Visit (HOSPITAL_COMMUNITY): Payer: Self-pay | Admitting: Endocrinology

## 2013-11-10 DIAGNOSIS — C73 Malignant neoplasm of thyroid gland: Secondary | ICD-10-CM

## 2013-11-23 ENCOUNTER — Ambulatory Visit (HOSPITAL_COMMUNITY): Payer: BC Managed Care – PPO

## 2013-11-29 ENCOUNTER — Encounter (HOSPITAL_COMMUNITY)
Admission: RE | Admit: 2013-11-29 | Discharge: 2013-11-29 | Disposition: A | Discharge status: Home / Self Care | Payer: BC Managed Care – PPO | Source: Ambulatory Visit | Attending: Endocrinology | Admitting: Endocrinology

## 2013-11-29 ENCOUNTER — Encounter (HOSPITAL_COMMUNITY): Payer: Self-pay

## 2013-11-29 DIAGNOSIS — C73 Malignant neoplasm of thyroid gland: Secondary | ICD-10-CM

## 2013-11-29 HISTORY — DX: Malignant neoplasm of thyroid gland: C73

## 2013-11-29 LAB — GLUCOSE, CAPILLARY: GLUCOSE-CAPILLARY: 77 mg/dL (ref 70–99)

## 2013-11-29 MED ORDER — FLUDEOXYGLUCOSE F - 18 (FDG) INJECTION
7.3000 | Freq: Once | INTRAVENOUS | Status: AC | PRN
  Administered 2013-11-29: 7.3 via INTRAVENOUS

## 2013-12-06 ENCOUNTER — Other Ambulatory Visit (HOSPITAL_COMMUNITY)
Admission: RE | Admit: 2013-12-06 | Discharge: 2013-12-06 | Disposition: A | Discharge status: Home / Self Care | Payer: BC Managed Care – PPO | Source: Ambulatory Visit | Attending: Obstetrics and Gynecology | Admitting: Obstetrics and Gynecology

## 2013-12-06 ENCOUNTER — Other Ambulatory Visit: Payer: Self-pay | Admitting: Obstetrics and Gynecology

## 2013-12-06 DIAGNOSIS — Z01419 Encounter for gynecological examination (general) (routine) without abnormal findings: Secondary | ICD-10-CM | POA: Insufficient documentation

## 2013-12-06 DIAGNOSIS — Z1151 Encounter for screening for human papillomavirus (HPV): Secondary | ICD-10-CM | POA: Insufficient documentation

## 2014-10-27 ENCOUNTER — Other Ambulatory Visit: Payer: Self-pay | Admitting: Endocrinology

## 2014-10-27 DIAGNOSIS — C73 Malignant neoplasm of thyroid gland: Secondary | ICD-10-CM

## 2014-11-07 ENCOUNTER — Ambulatory Visit
Admission: RE | Admit: 2014-11-07 | Discharge: 2014-11-07 | Disposition: A | Discharge status: Home / Self Care | Payer: BC Managed Care – PPO | Source: Ambulatory Visit | Attending: Endocrinology | Admitting: Endocrinology

## 2014-11-07 DIAGNOSIS — C73 Malignant neoplasm of thyroid gland: Secondary | ICD-10-CM

## 2014-12-14 ENCOUNTER — Other Ambulatory Visit: Payer: Self-pay | Admitting: Obstetrics and Gynecology

## 2014-12-14 ENCOUNTER — Other Ambulatory Visit (HOSPITAL_COMMUNITY)
Admission: RE | Admit: 2014-12-14 | Discharge: 2014-12-14 | Disposition: A | Discharge status: Home / Self Care | Payer: BC Managed Care – PPO | Source: Ambulatory Visit | Attending: Obstetrics and Gynecology | Admitting: Obstetrics and Gynecology

## 2014-12-14 DIAGNOSIS — Z01419 Encounter for gynecological examination (general) (routine) without abnormal findings: Secondary | ICD-10-CM | POA: Insufficient documentation

## 2014-12-18 LAB — CYTOLOGY - PAP

## 2015-08-20 ENCOUNTER — Other Ambulatory Visit: Payer: Self-pay

## 2015-08-20 DIAGNOSIS — Z1231 Encounter for screening mammogram for malignant neoplasm of breast: Secondary | ICD-10-CM

## 2015-09-03 ENCOUNTER — Ambulatory Visit
Admission: RE | Admit: 2015-09-03 | Discharge: 2015-09-03 | Disposition: A | Discharge status: Home / Self Care | Payer: BC Managed Care – PPO | Source: Ambulatory Visit

## 2015-09-03 DIAGNOSIS — Z1231 Encounter for screening mammogram for malignant neoplasm of breast: Secondary | ICD-10-CM

## 2015-09-05 ENCOUNTER — Other Ambulatory Visit: Payer: Self-pay | Admitting: Obstetrics and Gynecology

## 2015-09-05 DIAGNOSIS — R928 Other abnormal and inconclusive findings on diagnostic imaging of breast: Secondary | ICD-10-CM

## 2015-09-12 ENCOUNTER — Ambulatory Visit
Admission: RE | Admit: 2015-09-12 | Discharge: 2015-09-12 | Disposition: A | Discharge status: Home / Self Care | Payer: BC Managed Care – PPO | Source: Ambulatory Visit | Attending: Obstetrics and Gynecology | Admitting: Obstetrics and Gynecology

## 2015-09-12 DIAGNOSIS — R928 Other abnormal and inconclusive findings on diagnostic imaging of breast: Secondary | ICD-10-CM

## 2015-11-12 ENCOUNTER — Other Ambulatory Visit (HOSPITAL_COMMUNITY): Payer: Self-pay | Admitting: Endocrinology

## 2015-11-12 DIAGNOSIS — C73 Malignant neoplasm of thyroid gland: Secondary | ICD-10-CM

## 2015-11-26 ENCOUNTER — Encounter (HOSPITAL_COMMUNITY)
Admission: RE | Admit: 2015-11-26 | Discharge: 2015-11-26 | Disposition: A | Discharge status: Home / Self Care | Payer: BC Managed Care – PPO | Source: Ambulatory Visit | Attending: Endocrinology | Admitting: Endocrinology

## 2015-11-26 DIAGNOSIS — C73 Malignant neoplasm of thyroid gland: Secondary | ICD-10-CM | POA: Diagnosis not present

## 2015-11-26 DIAGNOSIS — E890 Postprocedural hypothyroidism: Secondary | ICD-10-CM | POA: Diagnosis not present

## 2015-11-26 MED ORDER — THYROTROPIN ALFA 1.1 MG IM SOLR
INTRAMUSCULAR | Status: AC
  Filled 2015-11-26: qty 0.9

## 2015-11-26 MED ORDER — THYROTROPIN ALFA 1.1 MG IM SOLR
0.9000 mg | INTRAMUSCULAR | Status: AC
  Administered 2015-11-26: 0.9 mg via INTRAMUSCULAR

## 2015-11-27 ENCOUNTER — Ambulatory Visit (HOSPITAL_COMMUNITY)
Admission: RE | Admit: 2015-11-27 | Discharge: 2015-11-27 | Disposition: A | Discharge status: Home / Self Care | Payer: BC Managed Care – PPO | Source: Ambulatory Visit | Attending: Endocrinology | Admitting: Endocrinology

## 2015-11-27 DIAGNOSIS — E890 Postprocedural hypothyroidism: Secondary | ICD-10-CM | POA: Insufficient documentation

## 2015-11-27 DIAGNOSIS — C73 Malignant neoplasm of thyroid gland: Secondary | ICD-10-CM | POA: Diagnosis not present

## 2015-11-27 MED ORDER — THYROTROPIN ALFA 1.1 MG IM SOLR
INTRAMUSCULAR | Status: AC
  Filled 2015-11-27: qty 0.9

## 2015-11-27 MED ORDER — THYROTROPIN ALFA 1.1 MG IM SOLR
0.9000 mg | INTRAMUSCULAR | Status: AC
  Administered 2015-11-27: 0.9 mg via INTRAMUSCULAR

## 2015-11-28 ENCOUNTER — Ambulatory Visit (HOSPITAL_COMMUNITY)
Admission: RE | Admit: 2015-11-28 | Discharge: 2015-11-28 | Disposition: A | Discharge status: Home / Self Care | Payer: BC Managed Care – PPO | Source: Ambulatory Visit | Attending: Endocrinology | Admitting: Endocrinology

## 2015-11-28 DIAGNOSIS — C73 Malignant neoplasm of thyroid gland: Secondary | ICD-10-CM | POA: Diagnosis not present

## 2015-11-28 LAB — HCG, SERUM, QUALITATIVE: PREG SERUM: NEGATIVE

## 2015-11-30 ENCOUNTER — Encounter (HOSPITAL_COMMUNITY)
Admission: RE | Admit: 2015-11-30 | Discharge: 2015-11-30 | Disposition: A | Discharge status: Home / Self Care | Payer: BC Managed Care – PPO | Source: Ambulatory Visit | Attending: Endocrinology | Admitting: Endocrinology

## 2015-11-30 DIAGNOSIS — C73 Malignant neoplasm of thyroid gland: Secondary | ICD-10-CM | POA: Diagnosis not present

## 2015-11-30 MED ORDER — SODIUM IODIDE I 131 CAPSULE
3.8000 | Freq: Once | INTRAVENOUS | Status: AC | PRN
  Administered 2015-11-30: 3.8 via ORAL

## 2015-12-25 ENCOUNTER — Other Ambulatory Visit: Payer: Self-pay | Admitting: Obstetrics and Gynecology

## 2015-12-25 ENCOUNTER — Other Ambulatory Visit (HOSPITAL_COMMUNITY)
Admission: RE | Admit: 2015-12-25 | Discharge: 2015-12-25 | Disposition: A | Discharge status: Home / Self Care | Payer: BC Managed Care – PPO | Source: Ambulatory Visit | Attending: Obstetrics and Gynecology | Admitting: Obstetrics and Gynecology

## 2015-12-25 DIAGNOSIS — Z01419 Encounter for gynecological examination (general) (routine) without abnormal findings: Secondary | ICD-10-CM | POA: Diagnosis present

## 2015-12-28 LAB — CYTOLOGY - PAP

## 2016-11-11 ENCOUNTER — Other Ambulatory Visit: Payer: Self-pay | Admitting: Endocrinology

## 2016-11-11 DIAGNOSIS — E041 Nontoxic single thyroid nodule: Secondary | ICD-10-CM

## 2016-11-20 ENCOUNTER — Ambulatory Visit
Admission: RE | Admit: 2016-11-20 | Discharge: 2016-11-20 | Disposition: A | Discharge status: Home / Self Care | Payer: BC Managed Care – PPO | Source: Ambulatory Visit | Attending: Endocrinology | Admitting: Endocrinology

## 2016-11-20 DIAGNOSIS — E041 Nontoxic single thyroid nodule: Secondary | ICD-10-CM

## 2016-11-26 ENCOUNTER — Other Ambulatory Visit: Payer: Self-pay | Admitting: Endocrinology

## 2016-11-26 DIAGNOSIS — C73 Malignant neoplasm of thyroid gland: Secondary | ICD-10-CM

## 2017-05-14 ENCOUNTER — Other Ambulatory Visit (HOSPITAL_COMMUNITY)
Admission: RE | Admit: 2017-05-14 | Discharge: 2017-05-14 | Disposition: A | Discharge status: Home / Self Care | Payer: BC Managed Care – PPO | Source: Ambulatory Visit | Attending: Obstetrics & Gynecology | Admitting: Obstetrics & Gynecology

## 2017-05-14 ENCOUNTER — Other Ambulatory Visit: Payer: Self-pay | Admitting: Obstetrics & Gynecology

## 2017-05-14 DIAGNOSIS — Z124 Encounter for screening for malignant neoplasm of cervix: Secondary | ICD-10-CM | POA: Diagnosis not present

## 2017-05-19 LAB — CYTOLOGY - PAP
DIAGNOSIS: NEGATIVE
HPV: NOT DETECTED

## 2017-05-27 ENCOUNTER — Other Ambulatory Visit: Payer: BC Managed Care – PPO
